# Patient Record
Sex: Male | Born: 1990 | Race: White | Hispanic: Yes | Marital: Single | Smoking: Never smoker
Health system: Southern US, Community
[De-identification: ages and names within clinical notes are randomized; demographics above are authoritative.]

## PROBLEM LIST (undated history)

## (undated) DIAGNOSIS — I10 Essential (primary) hypertension: Secondary | ICD-10-CM

## (undated) HISTORY — PX: APPENDECTOMY: SHX54

## (undated) HISTORY — PX: KNEE SURGERY: SHX244

---
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
## 2010-04-14 ENCOUNTER — Ambulatory Visit: Payer: Self-pay | Admitting: Family Medicine

## 2010-07-18 ENCOUNTER — Emergency Department: Payer: Self-pay | Admitting: Emergency Medicine

## 2011-01-05 ENCOUNTER — Emergency Department: Payer: Self-pay | Admitting: *Deleted

## 2014-04-24 ENCOUNTER — Ambulatory Visit: Payer: Self-pay | Admitting: Surgery

## 2014-05-04 ENCOUNTER — Ambulatory Visit
Admit: 2014-05-04 | Disposition: A | Discharge status: Home / Self Care | Payer: Self-pay | Attending: Internal Medicine | Admitting: Internal Medicine

## 2014-05-19 ENCOUNTER — Ambulatory Visit
Admit: 2014-05-19 | Disposition: A | Discharge status: Home / Self Care | Payer: Self-pay | Attending: Internal Medicine | Admitting: Internal Medicine

## 2014-06-12 LAB — SURGICAL PATHOLOGY

## 2014-06-18 NOTE — H&P (Signed)
   Subjective/Chief Complaint RUQ pain x 20 hours, nausea/vomiting   History of Present Illness 24 yo healthy male who presents with RUQ pain which began yesterday evening.  Began acutely.  Has never had before.  + nausea/vomiting.  No fevers, chills, night sweats, shortness of breath, cough, chest pain, diarrhea.  Does feel constipated.  No dysuria/hematuria   Past History H/o knee surgery   Code Status Full Code   Past Med/Surgical Hx:  no medical history:   left leg surgery:   ALLERGIES:  Cephalexin: Rash  Family and Social History:  Family History Coronary Artery Disease  Hypertension  Diabetes Mellitus   Social History negative tobacco, positive ETOH, Social EtOH   + Tobacco Prior (greater than 1 year)   Place of Living Home   Review of Systems:  Subjective/Chief Complaint RUQ pain, nausea/vomiting  -Complete ROS obtained, pertinent positives and negatives via HPI and below   Fever/Chills No   Cough No   Sputum No   Abdominal Pain Yes   Diarrhea No   Constipation Yes   Nausea/Vomiting Yes   SOB/DOE No   Chest Pain No   Dysuria No   Tolerating Diet No  Nauseated  Vomiting   Physical Exam:  GEN well developed, well nourished, no acute distress   HEENT pink conjunctivae, PERRL, hearing intact to voice, good dentition   RESP normal resp effort  clear BS  no use of accessory muscles   CARD regular rate  no murmur  No LE edema   ABD positive tenderness  no hernia  soft  normal BS   EXTR negative cyanosis/clubbing, negative edema   SKIN normal to palpation, No rashes, skin turgor good   NEURO cranial nerves intact, negative tremor, follows commands, cog wheel, strength:, motor/sensory function intact   PSYCH A+O to time, place, person, good insight    Assessment/Admission Diagnosis 24 yo presenting with RUQ pain, N/V.  Leukocytosis.  CT shows retrocecal/ascending colon appendix with periappendiceal stranding.   Plan 24 yo M with s/sx of  appendicitis.  Have recommended lap appendectomy.  Patient is anxious and deciding on plan.   Electronic Signatures: Jarvis NewcomerLundquist, Lew Prout A (MD)  (Signed 07-Mar-16 12:30)  Authored: CHIEF COMPLAINT and HISTORY, PAST MEDICAL/SURGIAL HISTORY, ALLERGIES, FAMILY AND SOCIAL HISTORY, REVIEW OF SYSTEMS, PHYSICAL EXAM, ASSESSMENT AND PLAN   Last Updated: 07-Mar-16 12:30 by Jarvis NewcomerLundquist, Lyriq Finerty A (MD)

## 2014-06-18 NOTE — Op Note (Signed)
PATIENT NAME:  Brian Mccann, Brian Mccann MR#:  [id number] DATE OF BIRTH:  [birth year]  DATE OF PROCEDURE:  04/24/2014  ATTENDING PHYSICIAN: Cristal Deerhristopher A. Nani Ingram, MD   PREOPERATIVE DIAGNOSIS: Acute appendicitis.   POSTOPERATIVE DIAGNOSIS: Acute appendicitis retrocecal and in right upper quadrant.   ANESTHESIA: General.   ESTIMATED BLOOD LOSS: 50 mL.   COMPLICATIONS: None.   SPECIMEN: Appendix.   INDICATION FOR SURGERY: Brian Mccann is a 24 year old male who presented with right upper quadrant pain, leukocytosis and a CT scan concerning for retroperitoneal appendix going up to his right upper quadrant. He was brought to the operating room for laparoscopic appendectomy.   DETAILS OF PROCEDURE: As follows: Informed consent was obtained. Brian Mccann was brought to the operating room suite. He was induced. Endotracheal tube was placed, general anesthesia was administered. His abdomen was then prepped and draped in standard surgical fashion. A timeout was then performed correctly identifying the patient name, operative site, and procedure to be performed. A supraumbilical incision was made. It was deepened down to the fascia. The fascia was incised. Two stay sutures were placed in the fasciotomy. A left lower quadrant suprapubic catheter was placed. The appendix was not visualized right away. I did mobilize the right colon up to proximally the mid colic, so I was able to isolate a very inflamed appendix. It did have to be tediously dissected off the retroperitoneum. The mesoappendix was stapled twice with a laparoscopic stapler. I then isolated the base of the appendix and it was stapled across that. I then brought it out through an Endo Catch bag. I was concerned because there was some oozing in the retroperitoneum. I therefore placed a 6919 [place] JP drain in the right upper quadrant through a right upper quadrant incision into the gallbladder fossa where the appendix was as well as in the right pericolic gutter.  After happy with everything, I then desufflated under direct visualization. I removed all the trocars. The supraumbilical fascia was closed with a figure-of-eight 0 Vicryl. All port sites were then closed with 4-0 Monocryl deep dermal. Steri-Strips, Telfa gauze and Tegaderm were then placed over the wounds. The patient was then awoken, extubated and brought to the postanesthesia care unit. There were no immediate complications. Needle, sponge, and instrument count was correct at the end of the procedure.    ____________________________ Si Raiderhristopher A. Amiria Orrison, MD cal:TM D: 04/25/2014 11:13:00 ET T: 04/25/2014 20:37:55 ET JOB#: [id number]  cc: Cristal Deerhristopher A. Kamilia Carollo, MD, <Dictator> Jarvis NewcomerHRISTOPHER A Isack Lavalley MD ELECTRONICALLY SIGNED 04/30/2014 11:35

## 2015-02-06 ENCOUNTER — Emergency Department
Admission: EM | Admit: 2015-02-06 | Discharge: 2015-02-06 | Disposition: A | Discharge status: Home / Self Care | Payer: Self-pay | Attending: Emergency Medicine | Admitting: Emergency Medicine

## 2015-02-06 ENCOUNTER — Encounter: Payer: Self-pay | Admitting: Emergency Medicine

## 2015-02-06 DIAGNOSIS — Z23 Encounter for immunization: Secondary | ICD-10-CM | POA: Insufficient documentation

## 2015-02-06 DIAGNOSIS — S61412A Laceration without foreign body of left hand, initial encounter: Secondary | ICD-10-CM | POA: Insufficient documentation

## 2015-02-06 DIAGNOSIS — G5602 Carpal tunnel syndrome, left upper limb: Secondary | ICD-10-CM | POA: Insufficient documentation

## 2015-02-06 DIAGNOSIS — Y998 Other external cause status: Secondary | ICD-10-CM | POA: Insufficient documentation

## 2015-02-06 DIAGNOSIS — Y9289 Other specified places as the place of occurrence of the external cause: Secondary | ICD-10-CM | POA: Insufficient documentation

## 2015-02-06 DIAGNOSIS — Y9389 Activity, other specified: Secondary | ICD-10-CM | POA: Insufficient documentation

## 2015-02-06 DIAGNOSIS — Y288XXA Contact with other sharp object, undetermined intent, initial encounter: Secondary | ICD-10-CM | POA: Insufficient documentation

## 2015-02-06 DIAGNOSIS — Z880 Allergy status to penicillin: Secondary | ICD-10-CM | POA: Insufficient documentation

## 2015-02-06 MED ORDER — TRAMADOL HCL 50 MG PO TABS
100.0000 mg | ORAL_TABLET | Freq: Once | ORAL | Status: AC
Start: 2015-02-06 — End: 2015-02-06
  Administered 2015-02-06: 100 mg via ORAL
  Filled 2015-02-06: qty 2

## 2015-02-06 MED ORDER — LIDOCAINE HCL (PF) 1 % IJ SOLN
2.0000 mL | Freq: Once | INTRAMUSCULAR | Status: DC
  Filled 2015-02-06: qty 5

## 2015-02-06 MED ORDER — CEPHALEXIN 500 MG PO CAPS: 500.0000 mg | ORAL_CAPSULE | Freq: Three times a day (TID) | ORAL | Status: AC

## 2015-02-06 MED ORDER — TRAMADOL HCL 50 MG PO TABS: 50.0000 mg | ORAL_TABLET | Freq: Four times a day (QID) | ORAL | Status: AC | PRN

## 2015-02-06 MED ORDER — TETANUS-DIPHTH-ACELL PERTUSSIS 5-2.5-18.5 LF-MCG/0.5 IM SUSP
0.5000 mL | Freq: Once | INTRAMUSCULAR | Status: AC
  Administered 2015-02-06: 0.5 mL via INTRAMUSCULAR
  Filled 2015-02-06: qty 0.5

## 2015-02-06 NOTE — ED Provider Notes (Signed)
CSN: [id number]     Arrival date & time 02/06/15  1849 History   First MD Initiated Contact with Patient 02/06/15 1918     Chief Complaint  Patient presents with  . Extremity Laceration     (Consider location/radiation/quality/duration/timing/severity/associated sxs/prior Treatment) HPI  24 year old male presents first department for evaluation of left hand laceration. Laceration occurred just prior to arrival. He cut his left hand on a screw. Laceration is along the first webspace. His tetanus is not up-to-date. He denies any acute numbness or tingling, does have chronic numbness and tingling in the thumb index middle and ring finger and median nerve distribution. No numbness and tingling has been present for years. He wears a Velcro wrist splint at nighttime to help with numbness, this does give him some relief. He denies any weakness. No limited range of motion in the left thumb after laceration. Pain is 6 out of 10.  History reviewed. No pertinent past medical history. Past Surgical History  Procedure Laterality Date  . Knee surgery    . Appendectomy     No family history on file. Social History  Substance Use Topics  . Smoking status: Never Smoker   . Smokeless tobacco: Never Used  . Alcohol Use: Yes    Review of Systems  Constitutional: Negative.  Negative for fever, chills, activity change and appetite change.  HENT: Negative for congestion, ear pain, mouth sores, rhinorrhea, sinus pressure, sore throat and trouble swallowing.   Eyes: Negative for photophobia, pain and discharge.  Respiratory: Negative for cough, chest tightness and shortness of breath.   Cardiovascular: Negative for chest pain and leg swelling.  Gastrointestinal: Negative for nausea, vomiting, abdominal pain, diarrhea and abdominal distention.  Genitourinary: Negative for dysuria and difficulty urinating.  Musculoskeletal: Negative for back pain, arthralgias and gait problem.  Skin: Positive for wound.  Negative for color change and rash.  Neurological: Negative for dizziness and headaches.  Hematological: Negative for adenopathy.  Psychiatric/Behavioral: Negative for behavioral problems and agitation.      Allergies  Amoxicillin  Home Medications   Prior to Admission medications   Medication Sig Start Date End Date Taking? Authorizing Provider  cephALEXin (KEFLEX) 500 MG capsule Take 1 capsule (500 mg total) by mouth 3 (three) times daily. 1 tablet by mouth 3 times a day 7 days 02/06/15 02/16/15  Evon Slackhomas C Sunya Humbarger, PA-C  traMADol (ULTRAM) 50 MG tablet Take 1 tablet (50 mg total) by mouth every 6 (six) hours as needed. 02/06/15   Evon Slackhomas C Amoy Steeves, PA-C   BP 155/8 mmHg  Pulse 91  Temp(Src) 98.4 F (36.9 C) (Oral)  Resp 18  Ht 5\' 10"  (1.778 m)  Wt 117.935 kg  BMI 37.31 kg/m2  SpO2 99% Physical Exam  Constitutional: He is oriented to person, place, and time. He appears well-developed and well-nourished.  HENT:  Head: Normocephalic and atraumatic.  Eyes: Conjunctivae and EOM are normal. Pupils are equal, round, and reactive to light.  Neck: Normal range of motion. Neck supple.  Cardiovascular: Normal rate, regular rhythm, normal heart sounds and intact distal pulses.   Pulmonary/Chest: Effort normal and breath sounds normal. No respiratory distress. He has no wheezes. He has no rales. He exhibits no tenderness.  Abdominal: Soft. Bowel sounds are normal. He exhibits no distension. There is no tenderness.  Musculoskeletal: Normal range of motion. He exhibits no edema or tenderness.  Left hand: Patient with full composite fist, strength 5 out of 5. No limited range of motion. Full thickness of thenar  eminence with no atrophy. He has a positive Tinel's and Phalen's sign. There is a 6 cm laceration along the first webspace. No signs of foreign body. No warmth erythema or drainage. Bleeding well controlled.  Neurological: He is alert and oriented to person, place, and time.  Skin: Skin is  warm and dry.  Psychiatric: He has a normal mood and affect. His behavior is normal. Judgment and thought content normal.    ED Course  Procedures (including critical care time) LACERATION REPAIR Performed by: Patience Musca Authorized by: Patience Musca Consent: Verbal consent obtained. Risks and benefits: risks, benefits and alternatives were discussed Consent given by: patient Patient identity confirmed: provided demographic data Prepped and Draped in normal sterile fashion Wound explored  Laceration Location: Left hand first webspace  Laceration Length: 6 cm  No Foreign Bodies seen or palpated  Anesthesia: local infiltration  Local anesthetic: lidocaine 1% % without epinephrine  Anesthetic total: 2 ml  Irrigation method: syringe Amount of cleaning: standard  Skin closure: 5-0 nylon   Number of sutures: 13 Technique: Single running suture   Patient tolerance: Patient tolerated the procedure well with no immediate complications. Labs Review Labs Reviewed - No data to display  Imaging Review No results found. I have personally reviewed and evaluated these images and lab results as part of my medical decision-making.   EKG Interpretation None      MDM   Final diagnoses:  Hand laceration, left, initial encounter  Carpal tunnel syndrome of left wrist    24 year old male with laceration to the left hand. Patient has a history of carpal tunnel syndrome. No thenar eminence. He will continue with Velcro wrist splint and follow-up with orthopedics. Laceration was repaired with 5-0 nylon sutures. There was no signs of neurological or tendon deficits. He is placed on prophylactic antibiotic's. Tetanus was updated. He will follow-up in 8-10 days for suture removal.    Evon Slack, PA-C 02/06/15 2011  Jennye Moccasin, MD 02/06/15 2115

## 2015-02-06 NOTE — ED Notes (Signed)
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 Pt states motor dropped and a screw cut his left hand. Pt presents with laceration from base of left thumb to wrist on palm. No active bleeding.

## 2015-02-06 NOTE — ED Notes (Signed)
Pt presents to ED with laceration to left hand.

## 2015-02-06 NOTE — Discharge Instructions (Signed)
Carpal Tunnel Release Carpal tunnel release is a surgical procedure to relieve numbness and pain in your hand that are caused by carpal tunnel syndrome. Your carpal tunnel is a narrow, hollow space in your wrist. It passes between your wrist bones and a band of connective tissue (transverse carpal ligament). The nerve that supplies most of your hand (median nerve) passes through this space, and so do the connections between your fingers and the muscles of your arm (tendons). Carpal tunnel syndrome makes this space swell and become narrow, and this causes pain and numbness. In carpal tunnel release surgery, a surgeon cuts through the transverse carpal ligament to make more room in the carpal tunnel space. You may have this surgery if other types of treatment have not worked. [facility] KNOW ABOUT:  Any allergies you have.  All medicines you are taking, including vitamins, herbs, eye drops, creams, and over-the-counter medicines.  Previous problems you or members of your family have had with the use of anesthetics.  Any blood disorders you have.  Previous surgeries you have had.  Medical conditions you have. RISKS AND COMPLICATIONS Generally, this is a safe procedure. However, problems may occur, including:  Bleeding.  Infection.  Injury to the median nerve.  Need for additional surgery. BEFORE THE PROCEDURE  Ask your health care provider about:  Changing or stopping your regular medicines. This is especially important if you are taking diabetes medicines or blood thinners.  Taking medicines such as aspirin and ibuprofen. These medicines can thin your blood. Do not take these medicines before your procedure if your health care provider instructs you not to.  Do not eat or drink anything after midnight on the night before the procedure or as directed by your health care provider.  Plan to have someone take you home after the procedure. PROCEDURE  An IV tube may  be inserted into a vein.  You will be given one of the following:  A medicine that numbs the wrist area (local anesthetic). You may also be given a medicine to make you relax (sedative).  A medicine that makes you go to sleep (general anesthetic).  Your arm, hand, and wrist will be cleaned with a germ-killing solution (antiseptic).  Your surgeon will make a surgical cut (incision) over the palm side of your wrist. The surgeon will pull aside the skin of your wrist to expose the carpal tunnel space.  The surgeon will cut the transverse carpal ligament.  The edges of the incision will be closed with stitches (sutures) or staples.  A bandage (dressing) will be placed over your wrist and wrapped around your hand and wrist. AFTER THE PROCEDURE  You may spend some time in a recovery area.  Your blood pressure, heart rate, breathing rate, and blood oxygen level will be monitored often until the medicines you were given have worn off.  You will likely have some pain. You will be given pain medicine.  You may need to wear a splint or a wrist brace over your dressing.   This information is not intended to replace advice given to you by your health care provider. Make sure you discuss any questions you have with your health care provider.   Document Released: 04/26/2003 Document Revised: 02/24/2014 Document Reviewed: 09/21/2013 Elsevier Interactive Patient Education 2016 [employer]  Laceration Care, Adult A laceration is a cut that goes through all of the layers of the skin and into the tissue that is right under the skin. Some  lacerations heal on their own. Others need to be closed with stitches (sutures), staples, skin adhesive strips, or skin glue. Proper laceration care minimizes the risk of infection and helps the laceration to heal better. HOW TO CARE FOR YOUR LACERATION If sutures or staples were used:  Keep the wound clean and dry.  If you were given a bandage (dressing), you  should change it at least one time per day or as told by your health care provider. You should also change it if it becomes wet or dirty.  Keep the wound completely dry for the first 24 hours or as told by your health care provider. After that time, you may shower or bathe. However, make sure that the wound is not soaked in water until after the sutures or staples have been removed.  Clean the wound one time each day or as told by your health care provider:  Wash the wound with soap and water.  Rinse the wound with water to remove all soap.  Pat the wound dry with a clean towel. Do not rub the wound.  After cleaning the wound, apply a thin layer of antibiotic ointmentas told by your health care provider. This will help to prevent infection and keep the dressing from sticking to the wound.  Have the sutures or staples removed as told by your health care provider. If skin adhesive strips were used:  Keep the wound clean and dry.  If you were given a bandage (dressing), you should change it at least one time per day or as told by your health care provider. You should also change it if it becomes dirty or wet.  Do not get the skin adhesive strips wet. You may shower or bathe, but be careful to keep the wound dry.  If the wound gets wet, pat it dry with a clean towel. Do not rub the wound.  Skin adhesive strips fall off on their own. You may trim the strips as the wound heals. Do not remove skin adhesive strips that are still stuck to the wound. They will fall off in time. If skin glue was used:  Try to keep the wound dry, but you may briefly wet it in the shower or bath. Do not soak the wound in water, such as by swimming.  After you have showered or bathed, gently pat the wound dry with a clean towel. Do not rub the wound.  Do not do any activities that will make you sweat heavily until the skin glue has fallen off on its own.  Do not apply liquid, cream, or ointment medicine to the  wound while the skin glue is in place. Using those may loosen the film before the wound has healed.  If you were given a bandage (dressing), you should change it at least one time per day or as told by your health care provider. You should also change it if it becomes dirty or wet.  If a dressing is placed over the wound, be careful not to apply tape directly over the skin glue. Doing that may cause the glue to be pulled off before the wound has healed.  Do not pick at the glue. The skin glue usually remains in place for 5-10 days, then it falls off of the skin. General Instructions  Take over-the-counter and prescription medicines only as told by your health care provider.  If you were prescribed an antibiotic medicine or ointment, take or apply it as told by your doctor.  Do not stop using it even if your condition improves.  To help prevent scarring, make sure to cover your wound with sunscreen whenever you are outside after stitches are removed, after adhesive strips are removed, or when glue remains in place and the wound is healed. Make sure to wear a sunscreen of at least 30 SPF.  Do not scratch or pick at the wound.  Keep all follow-up visits as told by your health care provider. This is important.  Check your wound every day for signs of infection. Watch for:  Redness, swelling, or pain.  Fluid, blood, or pus.  Raise (elevate) the injured area above the level of your heart while you are sitting or lying down, if possible. SEEK MEDICAL CARE IF:  You received a tetanus shot and you have swelling, severe pain, redness, or bleeding at the injection site.  You have a fever.  A wound that was closed breaks open.  You notice a bad smell coming from your wound or your dressing.  You notice something coming out of the wound, such as wood or glass.  Your pain is not controlled with medicine.  You have increased redness, swelling, or pain at the site of your wound.  You have  fluid, blood, or pus coming from your wound.  You notice a change in the color of your skin near your wound.  You need to change the dressing frequently due to fluid, blood, or pus draining from the wound.  You develop a new rash.  You develop numbness around the wound. SEEK IMMEDIATE MEDICAL CARE IF:  You develop severe swelling around the wound.  Your pain suddenly increases and is severe.  You develop painful lumps near the wound or on skin that is anywhere on your body.  You have a red streak going away from your wound.  The wound is on your hand or foot and you cannot properly move a finger or toe.  The wound is on your hand or foot and you notice that your fingers or toes look pale or bluish.   This information is not intended to replace advice given to you by your health care provider. Make sure you discuss any questions you have with your health care provider.   Document Released: 02/03/2005 Document Revised: 06/20/2014 Document Reviewed: 01/30/2014 Elsevier Interactive Patient Education [employer]

## 2015-05-11 DIAGNOSIS — E669 Obesity, unspecified: Secondary | ICD-10-CM | POA: Insufficient documentation

## 2015-05-11 DIAGNOSIS — I10 Essential (primary) hypertension: Secondary | ICD-10-CM | POA: Insufficient documentation

## 2015-05-26 ENCOUNTER — Emergency Department
Admission: EM | Admit: 2015-05-26 | Discharge: 2015-05-26 | Disposition: A | Discharge status: Home / Self Care | Payer: Self-pay | Attending: Emergency Medicine | Admitting: Emergency Medicine

## 2015-05-26 ENCOUNTER — Encounter: Payer: Self-pay | Admitting: Medical Oncology

## 2015-05-26 DIAGNOSIS — I10 Essential (primary) hypertension: Secondary | ICD-10-CM | POA: Insufficient documentation

## 2015-05-26 MED ORDER — HYDROCHLOROTHIAZIDE 12.5 MG PO TABS: 12.5000 mg | ORAL_TABLET | Freq: Every day | ORAL | Status: DC

## 2015-05-26 NOTE — ED Notes (Signed)
Pt reports he has came to the er a few times and every time his BP was elevated so pt got a Primary doctor and was seen 3 weeks ago. Pt reports his BP was normal at the check up so he wasn't placed on meds, pt reports since Wednesday he has been having headaches off and on so he got a BP cuff for home, BP has been trending high since Wednesday per patient. Pt denies sx's at this time.

## 2015-05-26 NOTE — ED Notes (Signed)
Spoke with Dr Scotty CourtStafford about pt and pt to be seen in [facility]. No additional test/labs to be done.

## 2015-05-26 NOTE — ED Notes (Signed)
Patient c/o "high blood pressure". Patient has been having dizziness for a few weeks. Patient had CT scan done on Wednesday and when he was there for his appointment they told him that his blood pressure was high.   Patient reports that he has been having daily headaches for the past week. Patient also reports having blurred vision.   Patient denies headache at this time but states that he is having some blurred vision.

## 2015-05-26 NOTE — Discharge Instructions (Signed)
DASH Eating Plan °DASH stands for "Dietary Approaches to Stop Hypertension." The DASH eating plan is a healthy eating plan that has been shown to reduce high blood pressure (hypertension). Additional health benefits may include reducing the risk of type 2 diabetes mellitus, heart disease, and stroke. The DASH eating plan may also help with weight loss. °WHAT DO I NEED TO KNOW ABOUT THE DASH EATING PLAN? °For the DASH eating plan, you will follow these general guidelines: °· Choose foods with a percent daily value for sodium of less than 5% (as listed on the food label). °· Use salt-free seasonings or herbs instead of table salt or sea salt. °· Check with your health care provider or pharmacist before using salt substitutes. °· Eat lower-sodium products, often labeled as "lower sodium" or "no salt added." °· Eat fresh foods. °· Eat more vegetables, fruits, and low-fat dairy products. °· Choose whole grains. Look for the word "whole" as the first word in the ingredient list. °· Choose fish and skinless chicken or turkey more often than red meat. Limit fish, poultry, and meat to 6 oz (170 g) each day. °· Limit sweets, desserts, sugars, and sugary drinks. °· Choose heart-healthy fats. °· Limit cheese to 1 oz (28 g) per day. °· Eat more home-cooked food and less restaurant, buffet, and fast food. °· Limit fried foods. °· Cook foods using methods other than frying. °· Limit canned vegetables. If you do use them, rinse them well to decrease the sodium. °· When eating at a restaurant, ask that your food be prepared with less salt, or no salt if possible. °WHAT FOODS CAN I EAT? °Seek help from a dietitian for individual calorie needs. °Grains °Whole grain or whole wheat bread. Brown rice. Whole grain or whole wheat pasta. Quinoa, bulgur, and whole grain cereals. Low-sodium cereals. Corn or whole wheat flour tortillas. Whole grain cornbread. Whole grain crackers. Low-sodium crackers. °Vegetables °Fresh or frozen vegetables  (raw, steamed, roasted, or grilled). Low-sodium or reduced-sodium tomato and vegetable juices. Low-sodium or reduced-sodium tomato sauce and paste. Low-sodium or reduced-sodium canned vegetables.  °Fruits °All fresh, canned (in natural juice), or frozen fruits. °Meat and Other Protein Products °Ground beef (85% or leaner), grass-fed beef, or beef trimmed of fat. Skinless chicken or turkey. Ground chicken or turkey. Pork trimmed of fat. All fish and seafood. Eggs. Dried beans, peas, or lentils. Unsalted nuts and seeds. Unsalted canned beans. °Dairy °Low-fat dairy products, such as skim or 1% milk, 2% or reduced-fat cheeses, low-fat ricotta or cottage cheese, or plain low-fat yogurt. Low-sodium or reduced-sodium cheeses. °Fats and Oils °Tub margarines without trans fats. Light or reduced-fat mayonnaise and salad dressings (reduced sodium). Avocado. Safflower, olive, or canola oils. Natural peanut or almond butter. °Other °Unsalted popcorn and pretzels. °The items listed above may not be a complete list of recommended foods or beverages. Contact your dietitian for more options. °WHAT FOODS ARE NOT RECOMMENDED? °Grains °White bread. White pasta. White rice. Refined cornbread. Bagels and croissants. Crackers that contain trans fat. °Vegetables °Creamed or fried vegetables. Vegetables in a cheese sauce. Regular canned vegetables. Regular canned tomato sauce and paste. Regular tomato and vegetable juices. °Fruits °Dried fruits. Canned fruit in light or heavy syrup. Fruit juice. °Meat and Other Protein Products °Fatty cuts of meat. Ribs, chicken wings, bacon, sausage, bologna, salami, chitterlings, fatback, hot dogs, bratwurst, and packaged luncheon meats. Salted nuts and seeds. Canned beans with salt. °Dairy °Whole or 2% milk, cream, half-and-half, and cream cheese. Whole-fat or sweetened yogurt. Full-fat   cheeses or blue cheese. Nondairy creamers and whipped toppings. Processed cheese, cheese spreads, or cheese  curds. °Condiments °Onion and garlic salt, seasoned salt, table salt, and sea salt. Canned and packaged gravies. Worcestershire sauce. Tartar sauce. Barbecue sauce. Teriyaki sauce. Soy sauce, including reduced sodium. Steak sauce. Fish sauce. Oyster sauce. Cocktail sauce. Horseradish. Ketchup and mustard. Meat flavorings and tenderizers. Bouillon cubes. Hot sauce. Tabasco sauce. Marinades. Taco seasonings. Relishes. °Fats and Oils °Butter, stick margarine, lard, shortening, ghee, and bacon fat. Coconut, palm kernel, or palm oils. Regular salad dressings. °Other °Pickles and olives. Salted popcorn and pretzels. °The items listed above may not be a complete list of foods and beverages to avoid. Contact your dietitian for more information. °WHERE CAN I FIND MORE INFORMATION? °[employer], [employer], [employer]: www.nhlbi.nih.gov/health/health-topics/topics/dash/ °  °This information is not intended to replace advice given to you by your health care provider. Make sure you discuss any questions you have with your health care provider. °  °Document Released: 01/23/2011 Document Revised: 02/24/2014 Document Reviewed: 12/08/2012 °Elsevier Interactive Patient Education ©2016 Else[employer] Inc. ° °How to Take Your Blood Pressure °HOW DO I GET A BLOOD PRESSURE MACHINE? °· You can buy an electronic home blood pressure machine at your local pharmacy. Insurance will sometimes cover the cost if you have a prescription. °· Ask your doctor what type of machine is best for you. There are different machines for your arm and your wrist. °· If you decide to buy a machine to check your blood pressure on your arm, first check the size of your arm so you can buy the right size cuff. To check the size of your arm:   °· Use a measuring tape that shows both inches and centimeters.   °· Wrap the measuring tape around the upper-middle part of your arm. You may need someone to help you measure.   °· Write down your arm measurement in both  inches and centimeters.   °· To measure your blood pressure correctly, it is important to have the right size cuff.   °· If your arm is up to 13 inches (up to 34 centimeters), get an adult cuff size. °· If your arm is 13 to 17 inches (35 to 44 centimeters), get a large adult cuff size.   °·  If your arm is 17 to 20 inches (45 to 52 centimeters), get an adult thigh cuff.   °WHAT DO THE NUMBERS MEAN?  °· There are two numbers that make up your blood pressure. For example: 120/80. °· The first number (120 in our example) is called the "systolic pressure." It is a measure of the pressure in your blood vessels when your heart is pumping blood. °· The second number (80 in our example) is called the "diastolic pressure." It is a measure of the pressure in your blood vessels when your heart is resting between beats. °· Your doctor will tell you what your blood pressure should be. °WHAT SHOULD I DO BEFORE I CHECK MY BLOOD PRESSURE?  °· Try to rest or relax for at least 30 minutes before you check your blood pressure. °· Do not smoke. °· Do not have any drinks with caffeine, such as: °· Soda. °· Coffee. °· Tea. °· Check your blood pressure in a quiet room. °· Sit down and stretch out your arm on a table. Keep your arm at about the level of your heart. Let your arm relax. °· Make sure that your legs are not crossed. °HOW DO I CHECK MY BLOOD PRESSURE? °· Follow   the directions that came with your machine. °· Make sure you remove any tight-fitting clothing from your arm or wrist. Wrap the cuff around your upper arm or wrist. You should be able to fit a finger between the cuff and your arm. If you cannot fit a finger between the cuff and your arm, it is too tight and should be removed and rewrapped. °· Some units require you to manually pump up the arm cuff. °· Automatic units inflate the cuff when you press a button. °· Cuff deflation is automatic in both models. °· After the cuff is inflated, the unit measures your blood  pressure and pulse. The readings are shown on a monitor. Hold still and breathe normally while the cuff is inflated. °· Getting a reading takes less than a minute. °· Some models store readings in a memory. Some provide a printout of readings. If your machine does not store your readings, keep a written record. °· Take readings with you to your next visit with your doctor. °  °This information is not intended to replace advice given to you by your health care provider. Make sure you discuss any questions you have with your health care provider. °  °Document Released: 01/17/2008 Document Revised: 02/24/2014 Document Reviewed: 03/31/2013 °Elsevier Interactive Patient Education ©2016 Elsevier Inc. ° °Hypertension °Hypertension, commonly called high blood pressure, is when the force of blood pumping through your arteries is too strong. Your arteries are the blood vessels that carry blood from your heart throughout your body. A blood pressure reading consists of a higher number over a lower number, such as 110/72. The higher number (systolic) is the pressure inside your arteries when your heart pumps. The lower number (diastolic) is the pressure inside your arteries when your heart relaxes. Ideally you want your blood pressure below 120/80. °Hypertension forces your heart to work harder to pump blood. Your arteries may become narrow or stiff. Having untreated or uncontrolled hypertension can cause heart attack, stroke, kidney disease, and other problems. °RISK FACTORS °Some risk factors for high blood pressure are controllable. Others are not.  °Risk factors you cannot control include:  °· Race. You may be at higher risk if you are African American. °· Age. Risk increases with age. °· Gender. Men are at higher risk than women before age 45 years. After age 65, women are at higher risk than men. °Risk factors you can control include: °· Not getting enough exercise or physical activity. °· Being overweight. °· Getting too  much fat, sugar, calories, or salt in your diet. °· Drinking too much alcohol. °SIGNS AND SYMPTOMS °Hypertension does not usually cause signs or symptoms. Extremely high blood pressure (hypertensive crisis) may cause headache, anxiety, shortness of breath, and nosebleed. °DIAGNOSIS °To check if you have hypertension, your health care provider will measure your blood pressure while you are seated, with your arm held at the level of your heart. It should be measured at least twice using the same arm. Certain conditions can cause a difference in blood pressure between your right and left arms. A blood pressure reading that is higher than normal on one occasion does not mean that you need treatment. If it is not clear whether you have high blood pressure, you may be asked to return on a different day to have your blood pressure checked again. Or, you may be asked to monitor your blood pressure at home for 1 or more weeks. °TREATMENT °Treating high blood pressure includes making lifestyle changes and possibly   taking medicine. Living a healthy lifestyle can help lower high blood pressure. You may need to change some of your habits. °Lifestyle changes may include: °· Following the DASH diet. This diet is high in fruits, vegetables, and whole grains. It is low in salt, red meat, and added sugars. °· Keep your sodium intake below 2,300 mg per day. °· Getting at least 30-45 minutes of aerobic exercise at least 4 times per week. °· Losing weight if necessary. °· Not smoking. °· Limiting alcoholic beverages. °· Learning ways to reduce stress. °Your health care provider may prescribe medicine if lifestyle changes are not enough to get your blood pressure under control, and if one of the following is true: °· You are 18-59 years of age and your systolic blood pressure is above 140. °· You are 60 years of age or older, and your systolic blood pressure is above 150. °· Your diastolic blood pressure is above 90. °· You have  diabetes, and your systolic blood pressure is over 140 or your diastolic blood pressure is over 90. °· You have kidney disease and your blood pressure is above 140/90. °· You have heart disease and your blood pressure is above 140/90. °Your personal target blood pressure may vary depending on your medical conditions, your age, and other factors. °HOME CARE INSTRUCTIONS °· Have your blood pressure rechecked as directed by your health care provider.   °· Take medicines only as directed by your health care provider. Follow the directions carefully. Blood pressure medicines must be taken as prescribed. The medicine does not work as well when you skip doses. Skipping doses also puts you at risk for problems. °· Do not smoke.   °· Monitor your blood pressure at home as directed by your health care provider.  °SEEK MEDICAL CARE IF:  °· You think you are having a reaction to medicines taken. °· You have recurrent headaches or feel dizzy. °· You have swelling in your ankles. °· You have trouble with your vision. °SEEK IMMEDIATE MEDICAL CARE IF: °· You develop a severe headache or confusion. °· You have unusual weakness, numbness, or feel faint. °· You have severe chest or abdominal pain. °· You vomit repeatedly. °· You have trouble breathing. °MAKE SURE YOU:  °· Understand these instructions. °· Will watch your condition. °· Will get help right away if you are not doing well or get worse. °  °This information is not intended to replace advice given to you by your health care provider. Make sure you discuss any questions you have with your health care provider. °  °Document Released: 02/03/2005 Document Revised: 06/20/2014 Document Reviewed: 11/26/2012 °Elsevier Interactive Patient Education ©2016 Elsevier Inc. ° °

## 2015-05-26 NOTE — ED Notes (Signed)
NAD noted at time of D/C. Pt denies questions or concerns. Pt ambulatory to the lobby at this time.  

## 2015-05-26 NOTE — ED Provider Notes (Signed)
[facility] Emergency Department Provider Note  ____________________________________________  Time seen: Approximately 1:32 PM  I have reviewed the triage vital signs and the nursing notes.   HISTORY  Chief Complaint Hypertension    HPI Brian SearingJuan Motton Jr. is a 25 y.o. male , NAD, presents to the emergency department with a few week history of elevated blood pressures, dizziness, headaches. States that he has been to the emergency department before in past and was told that his blood pressures were elevated. Had a CT completed as a routine recheck after cancer removal and they also noted his blood pressure was elevated. Patient was seen by his primary care provider a few weeks ago for blood pressure that time was within normal limits. The patient has scheduled an appointment to see a nutritionist in the coming weeks to help decrease weight which his primary care provider has set up and asked for him to follow up after the fact for repeat blood pressure screen.  Does note that he works with an [employer] and is out in the community working on a daily basis. He does eat out for breakfast and lunch most days. He notes that his weight is the highest that this pain is like time and feels that has contributed to his hypertension. Also has family history of hypertension with his mother but no one else in the family with such.Patient has had no chest pain, shortness of breath, numbness, weakness, tingling.   History reviewed. No pertinent past medical history.  There are no active problems to display for this patient.   Past Surgical History  Procedure Laterality Date  . Knee surgery    . Appendectomy      Current Outpatient Rx  Name  Route  Sig  Dispense  Refill  . hydrochlorothiazide (HYDRODIURIL) 12.5 MG tablet   Oral   Take 1 tablet (12.5 mg total) by mouth daily.   30 tablet   0   . traMADol (ULTRAM) 50 MG tablet   Oral   Take 1 tablet (50 mg total) by  mouth every 6 (six) hours as needed.   20 tablet   0     Allergies Amoxicillin and Keflex  No family history on file.  Social History Social History  Substance Use Topics  . Smoking status: Never Smoker   . Smokeless tobacco: Never Used  . Alcohol Use: Yes     Review of Systems  Constitutional: No fever/chills, fatigue, diaphoresis Eyes: No visual changes.  Cardiovascular: No chest pain, palpitations. Respiratory:  No shortness of breath.  Gastrointestinal: No abdominal pain.  No nausea, vomiting.   Musculoskeletal: Negative for back and neck pain.  Skin: Negative for rash. Neurological: Positive for headaches, but no focal weakness or numbness. Positive dizziness. No LOC. 10-point ROS otherwise negative.  ____________________________________________   PHYSICAL EXAM:  VITAL SIGNS: ED Triage Vitals  Enc Vitals Group     BP 05/26/15 1221 153/94 mmHg     Pulse Rate 05/26/15 1221 85     Resp 05/26/15 1221 16     Temp 05/26/15 1221 98.3 F (36.8 C)     Temp Source 05/26/15 1221 Oral     SpO2 05/26/15 1221 98 %     Weight 05/26/15 1221 275 lb (124.739 kg)     Height 05/26/15 1221 5\' 9"  (1.753 m)     Head Cir --      Peak Flow --      Pain Score --  Pain Loc --      Pain Edu? --      Excl. in GC? --     Constitutional: Alert and oriented. Well appearing and in no acute distress. Eyes: Conjunctivae are normal. PERRL. EOMI without pain.  Head: Atraumatic. Neck: No stridor, carotid bruits. Supple with full range of motion. Hematological/Lymphatic/Immunilogical: No cervical lymphadenopathy. Cardiovascular: Normal rate, regular rhythm. Normal S1 and S2.  No murmurs, rubs, gallops. Good peripheral circulation. Respiratory: Normal respiratory effort without tachypnea or retractions. Lungs CTAB. Neurologic:  Normal speech and language. No gross focal neurologic deficits are appreciated.  Skin:  Skin is warm, dry and intact. No rash noted. Psychiatric: Mood and  affect are normal. Speech and behavior are normal. Patient exhibits appropriate insight and judgement.   ____________________________________________   LABS  None  ____________________________________________  EKG  None ____________________________________________  RADIOLOGY  None ____________________________________________    PROCEDURES  Procedure(s) performed: None    Medications - No data to display   ____________________________________________   INITIAL IMPRESSION / ASSESSMENT AND PLAN / ED COURSE  Patient's diagnosis is consistent with uncontrolled essential hypertension. Patient will be discharged home with prescriptions for hydrochlorothiazide 12.5 mg tablets to take one tablet by mouth once daily. Patient is to follow up with primary care provider to continue evaluation and treatment of hypertension. Patient was encouraged to continue with appointments set up with a nutritionist and encouraged to follow dietary restriction outlined in exit care. Patient is given ED precautions to return to the ED for any worsening or new symptoms.    ____________________________________________  FINAL CLINICAL IMPRESSION(S) / ED DIAGNOSES  Final diagnoses:  Essential hypertension      NEW MEDICATIONS STARTED DURING THIS VISIT:  New Prescriptions   HYDROCHLOROTHIAZIDE (HYDRODIURIL) 12.5 MG TABLET    Take 1 tablet (12.5 mg total) by mouth daily.         Hope Pigeon, PA-C 05/26/15 1341  Myrna Blazer, MD 05/26/15 [phone]

## 2015-06-01 ENCOUNTER — Encounter: Payer: Self-pay | Admitting: Urgent Care

## 2015-06-01 ENCOUNTER — Emergency Department: Payer: Self-pay

## 2015-06-01 ENCOUNTER — Emergency Department
Admission: EM | Admit: 2015-06-01 | Discharge: 2015-06-01 | Disposition: A | Discharge status: Home / Self Care | Payer: Self-pay | Attending: Emergency Medicine | Admitting: Emergency Medicine

## 2015-06-01 DIAGNOSIS — R51 Headache: Secondary | ICD-10-CM | POA: Insufficient documentation

## 2015-06-01 DIAGNOSIS — R519 Headache, unspecified: Secondary | ICD-10-CM

## 2015-06-01 DIAGNOSIS — I10 Essential (primary) hypertension: Secondary | ICD-10-CM | POA: Insufficient documentation

## 2015-06-01 HISTORY — DX: Essential (primary) hypertension: I10

## 2015-06-01 MED ORDER — DIPHENHYDRAMINE HCL 50 MG/ML IJ SOLN
25.0000 mg | Freq: Once | INTRAMUSCULAR | Status: AC
  Administered 2015-06-01: 25 mg via INTRAVENOUS
  Filled 2015-06-01: qty 1

## 2015-06-01 MED ORDER — KETOROLAC TROMETHAMINE 30 MG/ML IJ SOLN
30.0000 mg | Freq: Once | INTRAMUSCULAR | Status: AC
  Administered 2015-06-01: 30 mg via INTRAVENOUS
  Filled 2015-06-01: qty 1

## 2015-06-01 MED ORDER — BUTALBITAL-APAP-CAFFEINE 50-325-40 MG PO TABS
2.0000 | ORAL_TABLET | Freq: Once | ORAL | Status: AC
  Administered 2015-06-01: 2 via ORAL
  Filled 2015-06-01: qty 2

## 2015-06-01 MED ORDER — METOCLOPRAMIDE HCL 5 MG/ML IJ SOLN
10.0000 mg | Freq: Once | INTRAMUSCULAR | Status: AC
  Administered 2015-06-01: 10 mg via INTRAVENOUS
  Filled 2015-06-01: qty 2

## 2015-06-01 MED ORDER — SODIUM CHLORIDE 0.9 % IV BOLUS (SEPSIS)
1000.0000 mL | Freq: Once | INTRAVENOUS | Status: AC
  Administered 2015-06-01: 1000 mL via INTRAVENOUS

## 2015-06-01 NOTE — ED Notes (Signed)
Pt states he has had HA on and off for 2 days. Pain behind eyes, posterior head and on top of head. Pt was seen last week here in ED, dx with hypertension, started on hydrochlorothiazide (pt states he has been compliant.)

## 2015-06-01 NOTE — Discharge Instructions (Signed)
You have been seen in the emergency department today for a headache. As we discussed please drink plenty of fluids, use Tylenol or Motrin at home for any further headache. Return to the emergency department for any worsening headache on the confusion, slurred speech, or any other symptom personally concerning to your self. Please follow-up with your primary care physician as scheduled this coming week for recheck/reevaluation, please see them sooner if your symptoms do not improve.   General Headache Without Cause A headache is pain or discomfort felt around the head or neck area. There are many causes and types of headaches. In some cases, the cause may not be found.  HOME CARE  Managing Pain  Take over-the-counter and prescription medicines only as told by your doctor.  Lie down in a dark, quiet room when you have a headache.  If directed, apply ice to the head and neck area:  Put ice in a plastic bag.  Place a towel between your skin and the bag.  Leave the ice on for 20 minutes, 2-3 times per day.  Use a heating pad or hot shower to apply heat to the head and neck area as told by your doctor.  Keep lights dim if bright lights bother you or make your headaches worse. Eating and Drinking  Eat meals on a regular schedule.  Lessen how much alcohol you drink.  Lessen how much caffeine you drink, or stop drinking caffeine. General Instructions  Keep all follow-up visits as told by your doctor. This is important.  Keep a journal to find out if certain things bring on headaches. For example, write down:  What you eat and drink.  How much sleep you get.  Any change to your diet or medicines.  Relax by getting a massage or doing other relaxing activities.  Lessen stress.  Sit up straight. Do not tighten (tense) your muscles.  Do not use tobacco products. This includes cigarettes, chewing tobacco, or e-cigarettes. If you need help quitting, ask your doctor.  Exercise  regularly as told by your doctor.  Get enough sleep. This often means 7-9 hours of sleep. GET HELP IF:  Your symptoms are not helped by medicine.  You have a headache that feels different than the other headaches.  You feel sick to your stomach (nauseous) or you throw up (vomit).  You have a fever. GET HELP RIGHT AWAY IF:   Your headache becomes really bad.  You keep throwing up.  You have a stiff neck.  You have trouble seeing.  You have trouble speaking.  You have pain in the eye or ear.  Your muscles are weak or you lose muscle control.  You lose your balance or have trouble walking.  You feel like you will pass out (faint) or you pass out.  You have confusion.   This information is not intended to replace advice given to you by your health care provider. Make sure you discuss any questions you have with your health care provider.   Document Released: 11/13/2007 Document Revised: 10/25/2014 Document Reviewed: 05/29/2014 Elsevier Interactive Patient Education [employer]2016 [employer].

## 2015-06-01 NOTE — ED Provider Notes (Signed)
[facility] Emergency Department Provider Note  Time seen: 4:29 AM  I have reviewed the triage vital signs and the nursing notes.   HISTORY  Chief Complaint Headache and Hypertension    HPI Brian SearingJuan Bratz Jr. is a 25 y.o. male with a past medical history of hypertension who presents the emergency department with a headache.According to the patient he was seen in the emergency department 1 week ago for hypertension. Patient is placed on hydrochlorothiazide, he states he has been taking this medication as prescribed. He has noticed that he is had increased urination on this medication. Patient states for the past 2-3 days he has been having an intermittent posterior headache. States the headache is somewhat worse when he lies down, states at times it will completely go away and at other times it will be moderate to severe. He states currently the headache is a 6/10, dull aching sensation to the occipital scalp. Patient denies any focal weakness or numbness confusion, slurred speech. Denies fever.     Past Medical History  Diagnosis Date  . Hypertension     There are no active problems to display for this patient.   Past Surgical History  Procedure Laterality Date  . Knee surgery    . Appendectomy      Current Outpatient Rx  Name  Route  Sig  Dispense  Refill  . hydrochlorothiazide (HYDRODIURIL) 12.5 MG tablet   Oral   Take 1 tablet (12.5 mg total) by mouth daily.   30 tablet   0   . traMADol (ULTRAM) 50 MG tablet   Oral   Take 1 tablet (50 mg total) by mouth every 6 (six) hours as needed.   20 tablet   0     Allergies Amoxicillin and Keflex  No family history on file.  Social History Social History  Substance Use Topics  . Smoking status: Never Smoker   . Smokeless tobacco: Never Used  . Alcohol Use: Yes    Review of Systems Constitutional: Negative for fever. Cardiovascular: Negative for chest pain. Respiratory: Negative for  shortness of breath. Gastrointestinal: Negative for abdominal pain Musculoskeletal: Negative for back pain Neurological: Positive for moderate headache. Denies focal weakness or numbness. 10-point ROS otherwise negative.  ____________________________________________   PHYSICAL EXAM:  VITAL SIGNS: ED Triage Vitals  Enc Vitals Group     BP 06/01/15 0331 162/107 mmHg     Pulse Rate 06/01/15 0331 79     Resp 06/01/15 0331 16     Temp 06/01/15 0331 98.8 F (37.1 C)     Temp Source 06/01/15 0331 Oral     SpO2 06/01/15 0331 100 %     Weight 06/01/15 0331 275 lb (124.739 kg)     Height --      Head Cir --      Peak Flow --      Pain Score 06/01/15 0332 4     Pain Loc --      Pain Edu? --      Excl. in GC? --     Constitutional: Alert and oriented. Well appearing and in no distress. Eyes: Normal exam ENT   Head: Normocephalic and atraumatic   Mouth/Throat: Mucous membranes are moist. Cardiovascular: Normal rate, regular rhythm. No murmur Respiratory: Normal respiratory effort without tachypnea nor retractions. Breath sounds are clear  Gastrointestinal: Soft and nontender. No distention. Musculoskeletal: Nontender with normal range of motion in all extremities.  Neurologic:  Normal speech and language. No gross focal  neurologic deficits are appreciated. 5/5 motor in all extremities. Equal grip strengths bilaterally. No pronator drift. Cranial nerves intact. Skin:  Skin is warm, dry and intact.  Psychiatric: Mood and affect are normal. Speech and behavior are normal.  ____________________________________________   RADIOLOGY  CT head shows mild decreased attenuation in the posterior parietal lobes, uncertain etiology however possibility would include press syndrome.  INITIAL IMPRESSION / ASSESSMENT AND PLAN / ED COURSE  Pertinent labs & imaging results that were available during my care of the patient were reviewed by me and considered in my medical decision making (see  chart for details).  Patient presents with intermittent headache for the past 3 days. Describes a 6/10 headache currently. CT scan shows nonspecific findings, however possibility would include press syndrome. Patient denies any confusion, visual changes. Patient appears very well with a current blood pressure of 142/85, I do not suspect press syndrome clinically. We will dose Fioricet in the emergency department, and monitor for headache improvement. Currently the patient has a normal/intact neurologic exam. Patient recently started hydrochlorothiazide, has noted increased urination, headache could be due to a degree of dehydration from his new blood pressure medication.  Patient states his headache has improved after Fioricet but remains present. We will place an IV and dose migraine medications and continue to assess the patient.  Patient states his headache is completely resolved. We will discharge the patient home. I discussed very strict return precautions with the patient to return to the emergency department for any further headache given his nonspecific CT results. Patient agreeable to plan.  ____________________________________________   FINAL CLINICAL IMPRESSION(S) / ED DIAGNOSES  Headache   Minna Antis, MD 06/01/15 [phone]

## 2015-06-01 NOTE — ED Notes (Addendum)
Patient presents with c/o "head pain" that is diffuse in nature. Patient states, "It is not a headache. It just feels weird. Uncomfortable even". Patient reports that pain is exacerbated by supine positioning and when he tilts his head backwards. Patient also with pain/pressure over his frontal sinuses. Denies visual changes and neck pain. Patient grossly NI; no facial droop; BUE grip strength WNL when tested against opposition; no pronator drift; (-) Romberg. Patient reports that he was recently here for HTN and was started on HCTZ; has been taking as prescribed per his report.

## 2015-06-01 NOTE — ED Notes (Signed)
Brown,MD consulted. MD made aware of presenting complaints and triage assessment. MD with VORB for: CT head without contrast. Orders to be entered and carried by this RN.

## 2015-06-06 DIAGNOSIS — G5600 Carpal tunnel syndrome, unspecified upper limb: Secondary | ICD-10-CM | POA: Insufficient documentation

## 2016-04-05 ENCOUNTER — Encounter: Payer: Self-pay | Admitting: Emergency Medicine

## 2016-04-05 ENCOUNTER — Emergency Department
Admission: EM | Admit: 2016-04-05 | Discharge: 2016-04-05 | Disposition: A | Discharge status: Home / Self Care | Payer: No Typology Code available for payment source | Attending: Emergency Medicine | Admitting: Emergency Medicine

## 2016-04-05 DIAGNOSIS — S39012A Strain of muscle, fascia and tendon of lower back, initial encounter: Secondary | ICD-10-CM | POA: Insufficient documentation

## 2016-04-05 DIAGNOSIS — Y92410 Unspecified street and highway as the place of occurrence of the external cause: Secondary | ICD-10-CM | POA: Insufficient documentation

## 2016-04-05 DIAGNOSIS — S161XXA Strain of muscle, fascia and tendon at neck level, initial encounter: Secondary | ICD-10-CM | POA: Insufficient documentation

## 2016-04-05 DIAGNOSIS — Z79899 Other long term (current) drug therapy: Secondary | ICD-10-CM | POA: Insufficient documentation

## 2016-04-05 DIAGNOSIS — I10 Essential (primary) hypertension: Secondary | ICD-10-CM | POA: Insufficient documentation

## 2016-04-05 DIAGNOSIS — Y9389 Activity, other specified: Secondary | ICD-10-CM | POA: Insufficient documentation

## 2016-04-05 DIAGNOSIS — Y999 Unspecified external cause status: Secondary | ICD-10-CM | POA: Insufficient documentation

## 2016-04-05 DIAGNOSIS — S199XXA Unspecified injury of neck, initial encounter: Secondary | ICD-10-CM | POA: Diagnosis present

## 2016-04-05 MED ORDER — TRAMADOL HCL 50 MG PO TABS: 50.0000 mg | ORAL_TABLET | Freq: Four times a day (QID) | ORAL | 0 refills | Status: DC | PRN

## 2016-04-05 MED ORDER — IBUPROFEN 800 MG PO TABS: 800.0000 mg | ORAL_TABLET | Freq: Three times a day (TID) | ORAL | 0 refills | Status: AC | PRN

## 2016-04-05 MED ORDER — CYCLOBENZAPRINE HCL 10 MG PO TABS: 10.0000 mg | ORAL_TABLET | Freq: Three times a day (TID) | ORAL | 0 refills | Status: DC | PRN
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
## 2017-05-10 IMAGING — CT CT HEAD W/O CM
2 series · 15 of 30 positions shown, 19 images · non-contrast
Comparison: CT of the head performed 05/07/2005

CLINICAL DATA: Acute onset of generalized head pain. Pressure at
the frontal sinuses. Initial encounter.

EXAM:
CT HEAD WITHOUT CONTRAST
TECHNIQUE: Contiguous axial images were obtained from the base of the skull
through the vertex without intravenous contrast.

[Series 2: soft tissue · axial · 0.42mm/px · z∈[-150,-130]mm · 2 of 31 slices shown]
[im 3/31  brain]
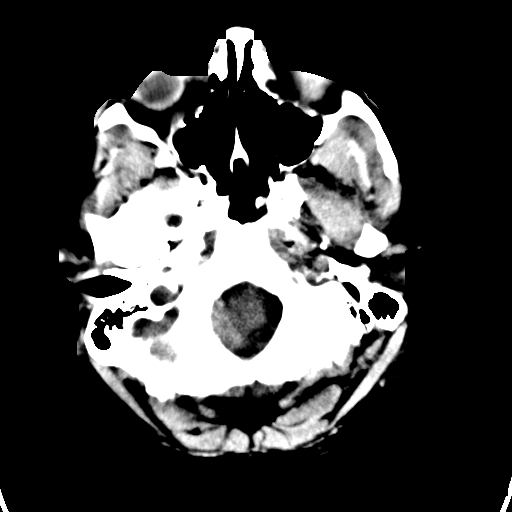
[im 7/31  brain]
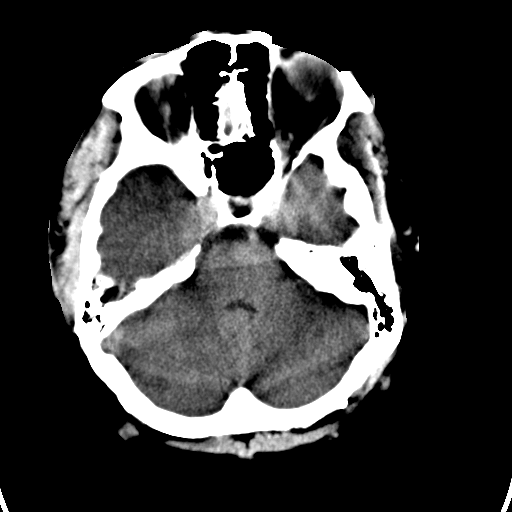

[Series 4: soft tissue recon · axial · 0.42mm/px · z∈[-186,-61]mm · 13 of 32 slices shown, 17 images]
[im 3/32  brain]
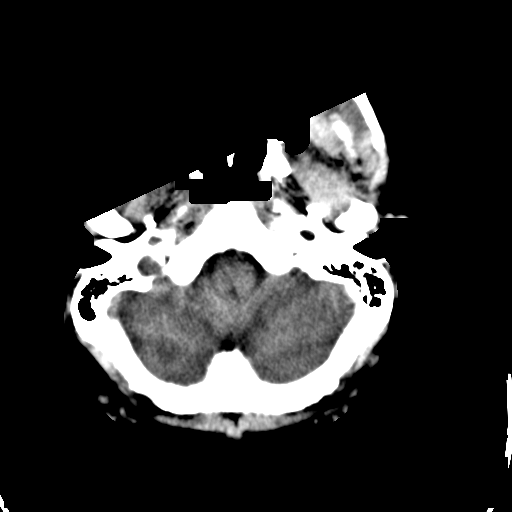
[im 3/32  bone]
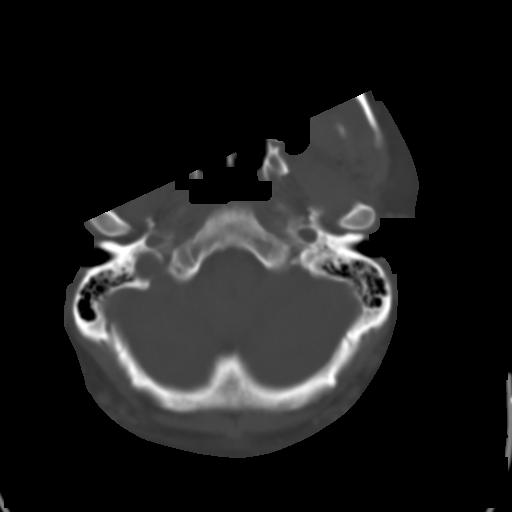
[im 5/32  brain]
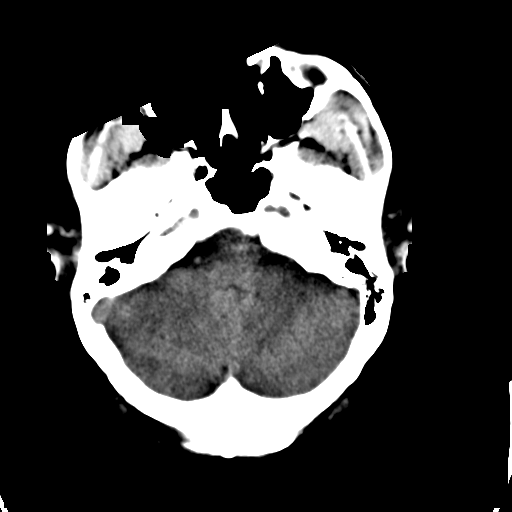
[im 7/32  brain]
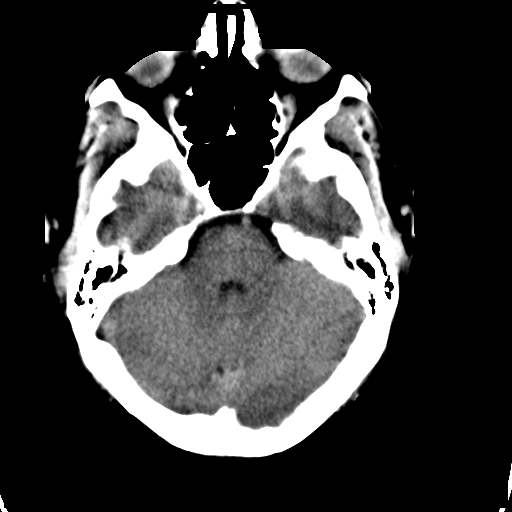
[im 9/32  brain]
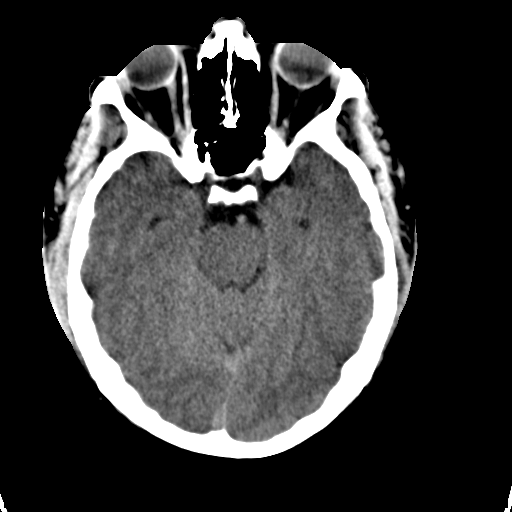
[im 12/32  brain]
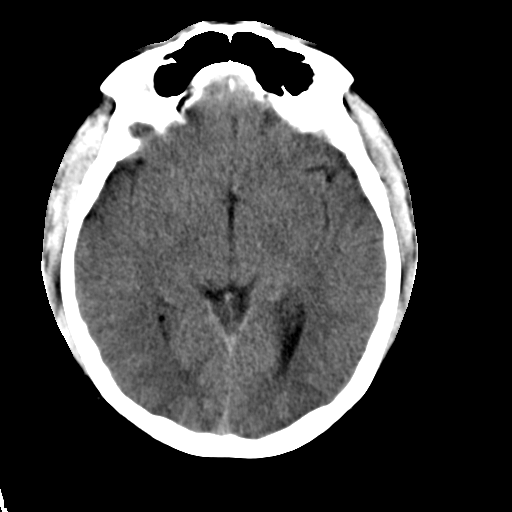
[im 12/32  bone]
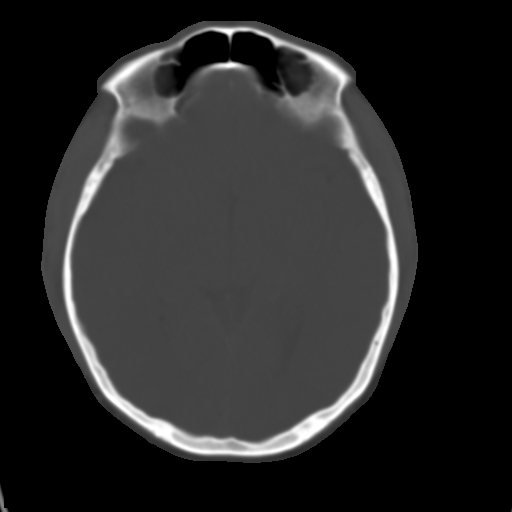
[im 14/32  brain]
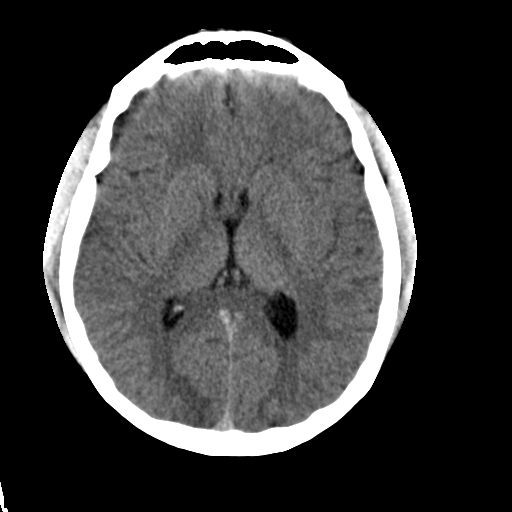
[im 16/32  brain]
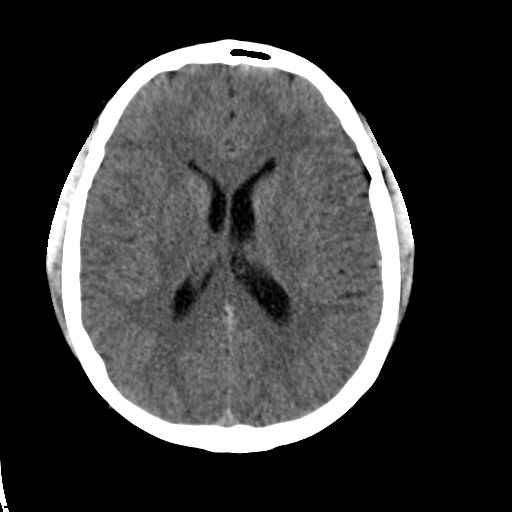
[im 18/32  brain]
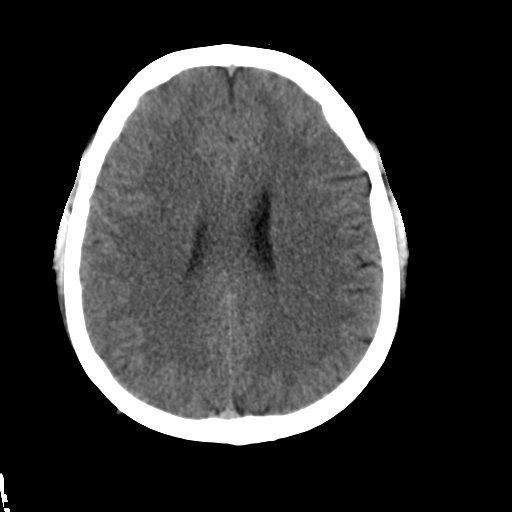
[im 20/32  brain]
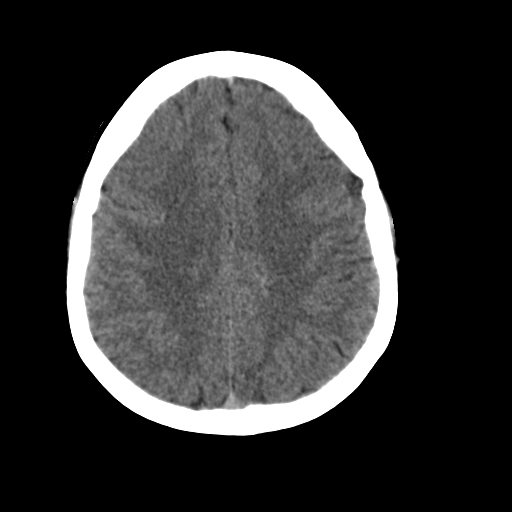
[im 20/32  bone]
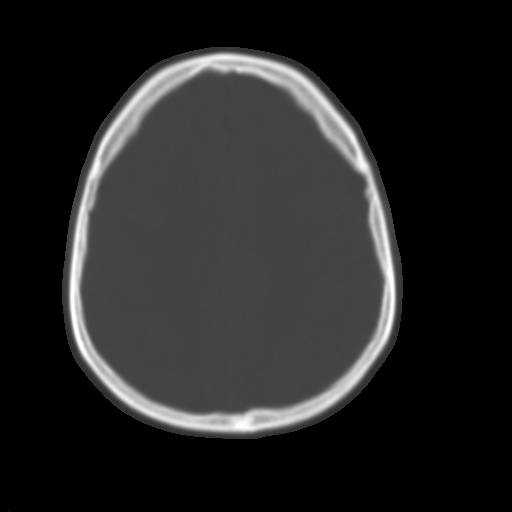
[im 23/32  brain]
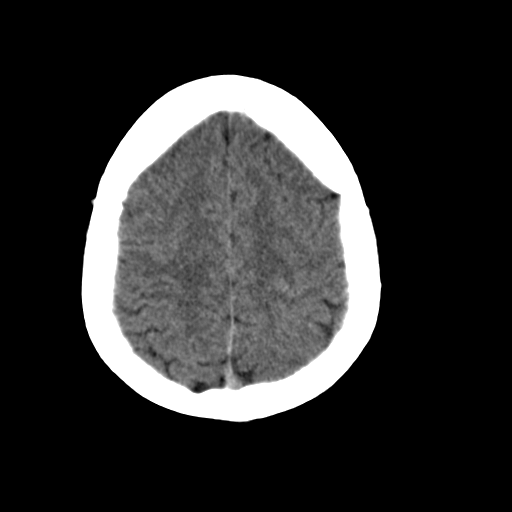
[im 25/32  brain]
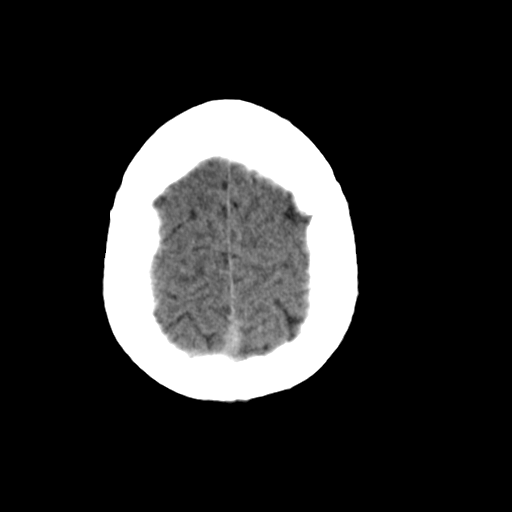
[im 27/32  brain]
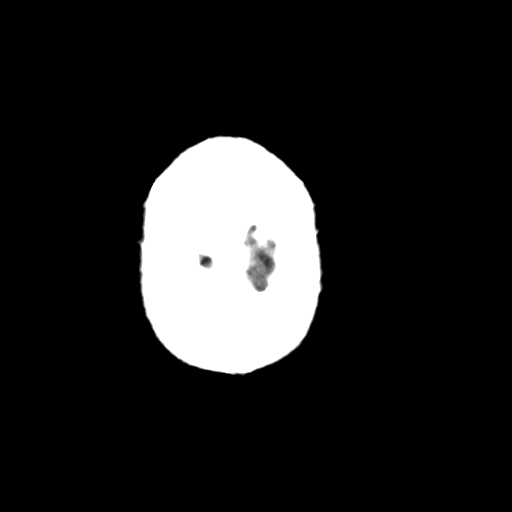
[im 29/32  brain]
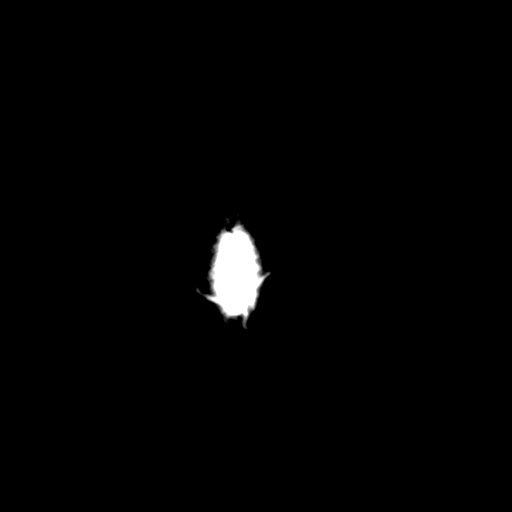
[im 29/32  bone]
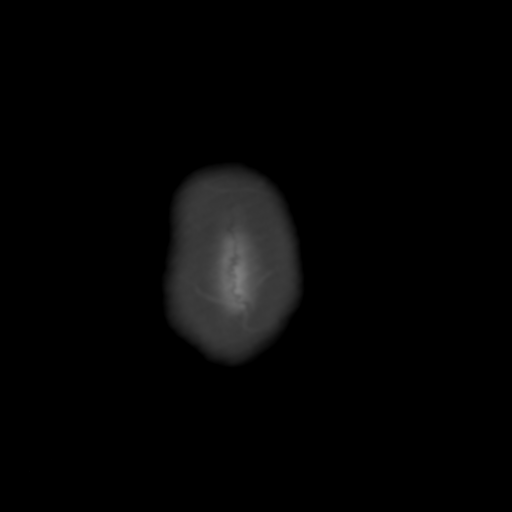

[15 of 30 positions shown; findings below may reference images not displayed]

FINDINGS: There is no evidence of acute infarction, mass lesion, or intra- or
extra-axial hemorrhage on CT.

Mildly decreased attenuation is noted at the posterior parietal
lobes bilaterally. This is of uncertain etiology.

The posterior fossa, including the cerebellum, brainstem and fourth
ventricle, is within normal limits. The third and lateral
ventricles, and basal ganglia are unremarkable in appearance. The
cerebral hemispheres are symmetric in appearance, with normal
gray-white differentiation. No mass effect or midline shift is seen.

There is no evidence of fracture; visualized osseous structures are
unremarkable in appearance. The orbits are within normal limits. A
mucus retention cyst or polyp is noted at the left maxillary sinus.
The remaining paranasal sinuses and mastoid air cells are
well-aerated. No significant soft tissue abnormalities are seen.
IMPRESSION: 1. Mildly decreased attenuation at the posterior parietal lobes
bilaterally. This is of uncertain etiology. MRI would be helpful for
further evaluation, as deemed clinically appropriate, to help
exclude posterior reversible encephalopathy syndrome.
2. Mucus retention cyst or polyp at the left maxillary sinus.

## 2018-08-18 DIAGNOSIS — M25511 Pain in right shoulder: Secondary | ICD-10-CM | POA: Insufficient documentation

## 2018-08-19 DIAGNOSIS — M549 Dorsalgia, unspecified: Secondary | ICD-10-CM | POA: Insufficient documentation

## 2021-01-01 DIAGNOSIS — M25562 Pain in left knee: Secondary | ICD-10-CM | POA: Insufficient documentation

## 2021-09-23 DIAGNOSIS — R400 Somnolence: Secondary | ICD-10-CM | POA: Insufficient documentation

## 2021-12-25 DIAGNOSIS — E119 Type 2 diabetes mellitus without complications: Secondary | ICD-10-CM | POA: Insufficient documentation

## 2022-12-10 DIAGNOSIS — R6882 Decreased libido: Secondary | ICD-10-CM | POA: Insufficient documentation

## 2023-04-03 ENCOUNTER — Ambulatory Visit (LOCAL_COMMUNITY_HEALTH_CENTER): Payer: Self-pay

## 2023-04-03 DIAGNOSIS — Z111 Encounter for screening for respiratory tuberculosis: Secondary | ICD-10-CM

## 2023-04-03 NOTE — Progress Notes (Signed)
In nurse clinic for PPDR. Patient had PPD placed on 03/31/23 with [facility]. Placement information provided to patient. PPD reading as follows: negative 0mm. Results provided to patient. Copy made and sent for scanning.   Abagail Kitchens, RN

## 2023-05-19 DIAGNOSIS — L659 Nonscarring hair loss, unspecified: Secondary | ICD-10-CM | POA: Insufficient documentation

## 2023-06-16 ENCOUNTER — Ambulatory Visit: Payer: Self-pay | Admitting: Urology

## 2023-06-16 ENCOUNTER — Encounter: Payer: Self-pay | Admitting: Urology

## 2023-06-16 VITALS — BP 147/86 | HR 69 | Ht 69.0 in | Wt 275.0 lb

## 2023-06-16 DIAGNOSIS — R6882 Decreased libido: Secondary | ICD-10-CM

## 2023-06-16 DIAGNOSIS — E291 Testicular hypofunction: Secondary | ICD-10-CM

## 2023-06-16 MED ORDER — CLOMIPHENE CITRATE 50 MG PO TABS: 25.0000 mg | ORAL_TABLET | Freq: Every day | ORAL | 6 refills | Status: DC

## 2023-06-16 NOTE — Patient Instructions (Signed)

## 2023-06-16 NOTE — Progress Notes (Signed)
   06/16/23 10:02 AM   Brian Mccann. 1991/01/16 [id number]  CC: Low testosterone, low libido  HPI: 33 year old male who reports at least a few years of fatigue and low energy, as well as recently decreased sex drive and some mild ED.  He has 1 biologic child, interested in potential biologic pregnancies in the future.  He has been working out at [employer] over the last 6 months and is lost about 30 pounds.  He has an upcoming sleep study pending in June.  He has morbid obesity with BMI of 41, and diabetes.  He had 2 testosterone levels with PCP <300.   PMH: Past Medical History:  Diagnosis Date   Hypertension     Surgical History: Past Surgical History:  Procedure Laterality Date   APPENDECTOMY     KNEE SURGERY     Family History: No family history on file.  Social History:  reports that he has never smoked. He has never used smokeless tobacco. He reports current alcohol use. He reports that he does not use drugs.  Physical Exam: BP (!) 147/86   Pulse 69   Ht 5\' 9"  (1.753 m)   Wt 275 lb (124.7 kg)   BMI 40.61 kg/m    Constitutional:  Alert and oriented, No acute distress. Cardiovascular: No clubbing, cyanosis, or edema. Respiratory: Normal respiratory effort, no increased work of breathing. GI: Abdomen is soft, nontender, nondistended, no abdominal masses   Laboratory Data: Reviewed   Assessment & Plan:   33 year old male with morbid obesity and diabetes and low testosterone levels.  We reviewed the AUA guidelines regarding evaluation and management of patients with low testosterone, and we discussed options include behavioral strategies, dietary changes, exercise, trial of Clomid, or exogenous testosterone replacement.  Risk and benefits were reviewed extensively, including impact of exogenous testosterone on fertility.  Using shared decision making he was interested in a trial of Clomid.  Trial of Clomid 25 mg daily Agree with sleep study as scheduled in  June RTC 6 weeks morning testosterone prior  Jay Meth, MD 06/16/2023  [facility] [address] [place], [place] [address] [phone]

## 2023-06-19 ENCOUNTER — Other Ambulatory Visit: Payer: Self-pay | Admitting: Urology

## 2023-06-22 MED ORDER — CLOMIPHENE CITRATE 50 MG PO TABS: ORAL_TABLET | ORAL | 5 refills | Status: AC

## 2023-07-28 ENCOUNTER — Ambulatory Visit: Admitting: Urology

## 2023-08-07 NOTE — Progress Notes (Deleted)
 Sleep Medicine   Office Visit  Patient Name: Brian Mccann. DOB: Dec 15, 1990 MRN [id number]    Chief Complaint: ***  Brief History:  Brian Mccann presents for an initial consult for sleep evaluation and to establish care. Patient has a *** history of ***. Sleep quality is ***. This is noted *** night. The patient's bed partner reports  *** at night. The patient relates the following symptoms: *** are also present. The patient goes to sleep at *** and wakes up at ***.  Sleep quality is *** when outside home environment.  Patient has noted *** of his legs at night that would disrupt his sleep.  The patient  relates *** unusual behavior during the night.  The patient relates *** as a history of psychiatric problems. The Epworth Sleepiness Score is *** out of 24 .  The patient relates  Cardiovascular risk factors include: *** The patient reports ***    ROS  General: (-) fever, (-) chills, (-) night sweat Nose and Sinuses: (-) nasal stuffiness or itchiness, (-) postnasal drip, (-) nosebleeds, (-) sinus trouble. Mouth and Throat: (-) sore throat, (-) hoarseness. Neck: (-) swollen glands, (-) enlarged thyroid, (-) neck pain. Respiratory: *** cough, *** shortness of breath, *** wheezing. Neurologic: *** numbness, *** tingling. Psychiatric: *** anxiety, *** depression Sleep behavior: ***sleep paralysis ***hypnogogic hallucinations ***dream enactment      ***vivid dreams ***cataplexy ***night terrors ***sleep walking   Current Medication: Outpatient Encounter Medications as of 08/10/2023  Medication Sig   clomiPHENE (CLOMID) 50 MG tablet TAKE 1/2 (ONE-HALF) TABLET BY MOUTH ONCE DAILY   ibuprofen (ADVIL,MOTRIN) 800 MG tablet Take 1 tablet (800 mg total) by mouth every 8 (eight) hours as needed for moderate pain.   traMADol  (ULTRAM ) 50 MG tablet Take 1 tablet (50 mg total) by mouth every 6 (six) hours as needed.   No facility-administered encounter medications on file as of 08/10/2023.    Surgical  History: Past Surgical History:  Procedure Laterality Date   APPENDECTOMY     KNEE SURGERY      Medical History: Past Medical History:  Diagnosis Date   Hypertension     Family History: Non contributory to the present illness  Social History: Social History   Socioeconomic History   Marital status: Married    Spouse name: Not on file   Number of children: Not on file   Years of education: Not on file   Highest education level: Not on file  Occupational History   Not on file  Tobacco Use   Smoking status: Never   Smokeless tobacco: Never  Substance and Sexual Activity   Alcohol use: Yes   Drug use: No   Sexual activity: Not on file  Other Topics Concern   Not on file  Social History Narrative   ** Merged History Encounter **       Social Drivers of Health   Financial Resource Strain: Not on file  Food Insecurity: Not on file  Transportation Needs: Not on file  Physical Activity: Not on file  Stress: Not on file  Social Connections: Not on file  Intimate Partner Violence: Not on file    Vital Signs: There were no vitals taken for this visit. There is no height or weight on file to calculate BMI.   Examination: General Appearance: The patient is well-developed, well-nourished, and in no distress. Neck Circumference: *** Skin: Gross inspection of skin unremarkable. Head: normocephalic, no gross deformities. Eyes: no gross deformities noted. ENT: ears appear grossly  normal Neurologic: Alert and oriented. No involuntary movements.    STOP BANG RISK ASSESSMENT S (snore) Have you been told that you snore?     YES/N   T (tired) Are you often tired, fatigued, or sleepy during the day?   YES/NO  O (obstruction) Do you stop breathing, choke, or gasp during sleep? YES/NO   P (pressure) Do you have or are you being treated for high blood pressure? YES/NO   B (BMI) Is your body index greater than 35 kg/m? YES/NO   A (age) Are you 33 years old or older?  NO   N (neck) Do you have a neck circumference greater than 16 inches?   YES/NO   G (gender) Are you a male? YES   TOTAL STOP/BANG "YES" ANSWERS                                                                A STOP-Bang score of 2 or less is considered low risk, and a score of 5 or more is high risk for having either moderate or severe OSA. For people who score 3 or 4, doctors may need to perform further assessment to determine how likely they are to have OSA.         EPWORTH SLEEPINESS SCALE:  Scale:  (0)= no chance of dozing; (1)= slight chance of dozing; (2)= moderate chance of dozing; (3)= high chance of dozing  Chance  Situtation    Sitting and reading: ***    Watching TV: ***    Sitting Inactive in public: ***    As a passenger in car: ***      Lying down to rest: ***    Sitting and talking: ***    Sitting quielty after lunch: ***    In a car, stopped in traffic: ***   TOTAL SCORE:   *** out of 24    SLEEP STUDIES:  None   LABS: No results found for this or any previous visit (from the past 2160 hours).  Radiology: No results found.  No results found.  No results found.    Assessment and Plan: Patient Active Problem List   Diagnosis Date Noted   Hair loss 05/19/2023   Reduced libido 12/10/2022   Type 2 diabetes mellitus without complications (HCC) 12/25/2021   Somnolence, daytime 09/23/2021   Pain in left knee 01/01/2021   Backache 08/19/2018   Pain in right shoulder 08/18/2018   Carpal tunnel syndrome 06/06/2015   Essential (primary) hypertension 05/11/2015   Obesity, unspecified 05/11/2015     PLAN OSA:   Patient evaluation suggests high risk of sleep disordered breathing due to *** Patient has comorbid cardiovascular risk factors including: *** which could be exacerbated by pathologic sleep-disordered breathing.  Suggest: *** to assess/treat the patient's sleep disordered breathing. The patient was also counselled on *** to  optimize sleep health.  PLAN hypersomnia:  Patient evaluation suggests significant daytime hypersomnia.  The Epworth Sleepiness Score is elevated at *** out of 24. Patient *** drowsy driving. The patient *** MVA due to sleepiness.  The patient *** restless leg symptoms which exacerbate *** for *** nights per week. The patient *** periodic limb movements which exacerbate ***  for *** nights per week. Suggest: ***  Also suggest ***  PLAN insomnia:  Patient evaluation suggests *** insomnia. This is a chronic disorder. This has been a concern for *** and causes impaired daytime functioning. The patient exhibits comorbid ***  The history *** suggest the insomnia predates the use of hypnotic medications. The symptoms *** with the discontinuation of these medications. There is no obvious medical, psychiatric or pharmacologic abuse issues ot account for the insomnia.  Treatment recommendations include: *** The patient should maintain a sleep log and calculate total sleep time for 1-2 weeks. Set bed and wake times for achieve 85% sleep efficiency for one week. Once this is achieved  time in bed can be gradually increased. A pharmacologic treatment approach would include a trial of *** for the next ***  months. During this time the patient is to maintain a sleep diary to track progress.    ***  General Counseling: I have discussed the findings of the evaluation and examination with Verne.  I have also discussed any further diagnostic evaluation thatmay be needed or ordered today. Vernon verbalizes understanding of the findings of todays visit. We also reviewed his medications today and discussed drug interactions and side effects including but not limited excessive drowsiness and altered mental states. We also discussed that there is always a risk not just to him but also people around him. he has been encouraged to call the office with any questions or concerns that should arise related to todays visit.  No  orders of the defined types were placed in this encounter.       I have personally obtained a history, evaluated the patient, evaluated pertinent data, formulated the assessment and plan and placed orders.    Cordie Deters, MD [facility] Diplomate ABMS Pulmonary and Critical Care Medicine Sleep medicine

## 2023-08-10 ENCOUNTER — Ambulatory Visit: Payer: Self-pay | Admitting: Internal Medicine

## 2023-08-13 ENCOUNTER — Ambulatory Visit: Admitting: Urology

## 2023-10-09 NOTE — Progress Notes (Signed)
 No show
